# Patient Record
Sex: Male | Born: 1975 | Race: Black or African American | Hispanic: No | Marital: Single | Smoking: Current every day smoker
Health system: Southern US, Community
[De-identification: ages and names within clinical notes are randomized; demographics above are authoritative.]

---

## 2005-08-06 ENCOUNTER — Emergency Department: Payer: Self-pay | Admitting: Unknown Physician Specialty

## 2006-09-29 ENCOUNTER — Emergency Department: Payer: Self-pay | Admitting: Emergency Medicine

## 2006-10-08 ENCOUNTER — Emergency Department: Payer: Self-pay | Admitting: Emergency Medicine
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
## 2015-07-19 ENCOUNTER — Other Ambulatory Visit
Admission: EM | Admit: 2015-07-19 | Discharge: 2015-07-19 | Disposition: A | Discharge status: Home / Self Care | Attending: Family Medicine | Admitting: Family Medicine

## 2015-07-19 ENCOUNTER — Emergency Department: Admission: EM | Admit: 2015-07-19 | Discharge: 2015-07-19 | Discharge status: Absent without leave

## 2015-07-19 NOTE — ED Notes (Signed)
Pt brought in by [employer] for police blood draw only.  Pt is under arrest and police has warrant present, pt is consenting to blood draw.  Blood drawn from left AC and cleansed with betadine.  Samples given to [employer] officer A.  Troy Silva, pt tolerated procedure well.  Pt released to [employer] after sample drawn.

## 2016-07-24 ENCOUNTER — Emergency Department
Admission: EM | Admit: 2016-07-24 | Discharge: 2016-07-24 | Disposition: A | Discharge status: Home / Self Care | Attending: Emergency Medicine | Admitting: Emergency Medicine

## 2016-07-24 DIAGNOSIS — Y929 Unspecified place or not applicable: Secondary | ICD-10-CM | POA: Insufficient documentation

## 2016-07-24 DIAGNOSIS — W57XXXA Bitten or stung by nonvenomous insect and other nonvenomous arthropods, initial encounter: Secondary | ICD-10-CM | POA: Insufficient documentation

## 2016-07-24 DIAGNOSIS — L03113 Cellulitis of right upper limb: Secondary | ICD-10-CM | POA: Insufficient documentation

## 2016-07-24 DIAGNOSIS — Y999 Unspecified external cause status: Secondary | ICD-10-CM | POA: Insufficient documentation

## 2016-07-24 DIAGNOSIS — Y939 Activity, unspecified: Secondary | ICD-10-CM | POA: Insufficient documentation

## 2016-07-24 DIAGNOSIS — S50861A Insect bite (nonvenomous) of right forearm, initial encounter: Secondary | ICD-10-CM | POA: Insufficient documentation

## 2016-07-24 MED ORDER — OXYCODONE-ACETAMINOPHEN 5-325 MG PO TABS
1.0000 | ORAL_TABLET | Freq: Once | ORAL | Status: AC
  Administered 2016-07-24: 1 via ORAL
  Filled 2016-07-24: qty 1

## 2016-07-24 MED ORDER — SULFAMETHOXAZOLE-TRIMETHOPRIM 800-160 MG PO TABS: 2.0000 | ORAL_TABLET | Freq: Two times a day (BID) | ORAL | 0 refills | Status: AC

## 2016-07-24 MED ORDER — IBUPROFEN 800 MG PO TABS: 800.0000 mg | ORAL_TABLET | Freq: Three times a day (TID) | ORAL | 0 refills | Status: AC | PRN

## 2016-07-24 MED ORDER — SULFAMETHOXAZOLE-TRIMETHOPRIM 800-160 MG PO TABS
2.0000 | ORAL_TABLET | Freq: Once | ORAL | Status: AC
  Administered 2016-07-24: 2 via ORAL
  Filled 2016-07-24: qty 2

## 2016-07-24 MED ORDER — OXYCODONE-ACETAMINOPHEN 5-325 MG PO TABS: 1.0000 | ORAL_TABLET | ORAL | 0 refills | Status: AC | PRN

## 2016-07-24 MED ORDER — IBUPROFEN 800 MG PO TABS
800.0000 mg | ORAL_TABLET | Freq: Once | ORAL | Status: AC
  Administered 2016-07-24: 800 mg via ORAL
  Filled 2016-07-24: qty 1

## 2016-07-24 NOTE — Discharge Instructions (Signed)
1. Take antibiotic as prescribed (Bactrim DS 2 tablets twice daily 10 days). 2. Apply warm compress to affected area several times daily. 3. You may take pain medicines as needed (Motrin/Percocet #15). 4. Return to the ER for worsening symptoms, persistent vomiting, fever or other concerns.

## 2016-07-24 NOTE — ED Provider Notes (Signed)
[facility] Emergency Department Provider Note   ____________________________________________   First MD Initiated Contact with Patient 07/24/16 0401     (approximate)  I have reviewed the triage vital signs and the nursing notes.   HISTORY  Chief Complaint Wound Check    HPI Troy NANCARROW is a 40 y.o. male who presents to the ED from home with a chief complaint of insect bite. Patient reports insect bite to right forearm 3 days ago; thinks it might have been a spider.Complains of increased swelling and pain to affected area since. Denies associated fever, chills, chest pain, shortness of breath, abdominal pain, nausea, vomiting, diarrhea. Denies recent travel or trauma. Nothing makes his symptoms better or worse.   Past medical history None  There are no active problems to display for this patient.   No past surgical history on file.  Prior to Admission medications   Medication Sig Start Date End Date Taking? Authorizing Provider  ibuprofen (ADVIL,MOTRIN) 800 MG tablet Take 1 tablet (800 mg total) by mouth every 8 (eight) hours as needed for moderate pain. 07/24/16   Irean Hong, MD  oxyCODONE-acetaminophen (ROXICET) 5-325 MG tablet Take 1 tablet by mouth every 4 (four) hours as needed for severe pain. 07/24/16   Irean Hong, MD  sulfamethoxazole-trimethoprim (BACTRIM DS,SEPTRA DS) 800-160 MG tablet Take 2 tablets by mouth 2 (two) times daily. 07/24/16   Irean Hong, MD    Allergies Review of patient's allergies indicates no known allergies.  No family history on file.  Social History Social History  Substance Use Topics  . Smoking status: Not on file  . Smokeless tobacco: Not on file  . Alcohol use Not on file  Smoker  Review of Systems  Constitutional: No fever/chills. Eyes: No visual changes. ENT: No sore throat. Cardiovascular: Denies chest pain. Respiratory: Denies shortness of breath. Gastrointestinal: No abdominal pain.   No nausea, no vomiting.  No diarrhea.  No constipation. Genitourinary: Negative for dysuria. Musculoskeletal: Positive for right forearm pain secondary to insect bite. Negative for back pain. Skin: Negative for rash. Neurological: Negative for headaches, focal weakness or numbness.  10-point ROS otherwise negative.  ____________________________________________   PHYSICAL EXAM:  VITAL SIGNS: ED Triage Vitals [07/24/16 0138]  Enc Vitals Group     BP (!) 162/89     Pulse Rate (!) 103     Resp 18     Temp 98.9 F (37.2 C)     Temp Source Oral     SpO2 95 %     Weight 280 lb (127 kg)     Height 5\' 11"  (1.803 m)     Head Circumference      Peak Flow      Pain Score 8     Pain Loc      Pain Edu?      Excl. in GC?     Constitutional: Sleeping, easily awakened for exam. Alert and oriented. Well appearing and in no acute distress. Eyes: Conjunctivae are normal. PERRL. EOMI. Head: Atraumatic. Nose: No congestion/rhinnorhea. Mouth/Throat: Mucous membranes are moist.  Oropharynx non-erythematous. Neck: No stridor.   Cardiovascular: Normal rate, regular rhythm. Grossly normal heart sounds.  Good peripheral circulation. Respiratory: Normal respiratory effort.  No retractions. Lungs CTAB. Gastrointestinal: Soft and nontender. No distention. No abdominal bruits. No CVA tenderness. Musculoskeletal:  Right forearm with approximately 3 x 3 cm area of warmth and erythema with central scab. No associated fluctuance. Neurologic:  Normal speech and language.  No gross focal neurologic deficits are appreciated. No gait instability. Skin:  Skin is warm, dry and intact. No rash noted. Psychiatric: Mood and affect are normal. Speech and behavior are normal.  ____________________________________________   LABS (all labs ordered are listed, but only abnormal results are displayed)  Labs Reviewed - No data to  display ____________________________________________  EKG  None ____________________________________________  RADIOLOGY  None ____________________________________________   PROCEDURES  Procedure(s) performed: None  Procedures  Critical Care performed: No  ____________________________________________   INITIAL IMPRESSION / ASSESSMENT AND PLAN / ED COURSE  Pertinent labs & imaging results that were available during my care of the patient were reviewed by me and considered in my medical decision making (see chart for details).  40 year old male who presents with infected insect bite. Will place on Bactrim, advised warm compresses and encourage patient to follow-up with urgent care for recheck. Strict return precautions given. Patient verbalizes understanding and agrees with plan of care.  Clinical Course     ____________________________________________   FINAL CLINICAL IMPRESSION(S) / ED DIAGNOSES  Final diagnoses:  Cellulitis of right upper extremity  Insect bite, initial encounter      NEW MEDICATIONS STARTED DURING THIS VISIT:  New Prescriptions   IBUPROFEN (ADVIL,MOTRIN) 800 MG TABLET    Take 1 tablet (800 mg total) by mouth every 8 (eight) hours as needed for moderate pain.   OXYCODONE-ACETAMINOPHEN (ROXICET) 5-325 MG TABLET    Take 1 tablet by mouth every 4 (four) hours as needed for severe pain.   SULFAMETHOXAZOLE-TRIMETHOPRIM (BACTRIM DS,SEPTRA DS) 800-160 MG TABLET    Take 2 tablets by mouth 2 (two) times daily.     Note:  This document was prepared using Dragon voice recognition software and may include unintentional dictation errors.    Irean HongJade J Noralee Dutko, MD 07/24/16 [id number]

## 2016-07-24 NOTE — ED Triage Notes (Signed)
Pt noted are to right forearm monday that was small.  Since then has had increased swelling and pain. States thinks it might be a spider bite but did not see insect.

## 2016-09-21 ENCOUNTER — Emergency Department
Admission: EM | Admit: 2016-09-21 | Discharge: 2016-09-22 | Discharge status: Against Medical Advice | Payer: Self-pay | Source: Home / Self Care

## 2016-09-22 ENCOUNTER — Emergency Department (HOSPITAL_COMMUNITY)
Admission: EM | Admit: 2016-09-22 | Discharge: 2016-09-22 | Disposition: A | Discharge status: Home / Self Care | Payer: Self-pay | Attending: Emergency Medicine | Admitting: Emergency Medicine

## 2016-09-22 ENCOUNTER — Encounter (HOSPITAL_COMMUNITY): Payer: Self-pay | Admitting: Emergency Medicine

## 2016-09-22 DIAGNOSIS — F17210 Nicotine dependence, cigarettes, uncomplicated: Secondary | ICD-10-CM | POA: Insufficient documentation

## 2016-09-22 DIAGNOSIS — Y92009 Unspecified place in unspecified non-institutional (private) residence as the place of occurrence of the external cause: Secondary | ICD-10-CM | POA: Insufficient documentation

## 2016-09-22 DIAGNOSIS — Y9389 Activity, other specified: Secondary | ICD-10-CM | POA: Insufficient documentation

## 2016-09-22 DIAGNOSIS — W231XXA Caught, crushed, jammed, or pinched between stationary objects, initial encounter: Secondary | ICD-10-CM | POA: Insufficient documentation

## 2016-09-22 DIAGNOSIS — Y999 Unspecified external cause status: Secondary | ICD-10-CM | POA: Insufficient documentation

## 2016-09-22 DIAGNOSIS — S61411A Laceration without foreign body of right hand, initial encounter: Secondary | ICD-10-CM | POA: Insufficient documentation

## 2016-09-22 DIAGNOSIS — Z23 Encounter for immunization: Secondary | ICD-10-CM | POA: Insufficient documentation

## 2016-09-22 MED ORDER — BACITRACIN ZINC 500 UNIT/GM EX OINT: 1.0000 "application " | TOPICAL_OINTMENT | Freq: Two times a day (BID) | CUTANEOUS | 0 refills | Status: AC

## 2016-09-22 MED ORDER — LIDOCAINE-EPINEPHRINE (PF) 1 %-1:200000 IJ SOLN
10.0000 mL | Freq: Once | INTRAMUSCULAR | Status: AC
  Administered 2016-09-22: 10 mL via INTRADERMAL
  Filled 2016-09-22: qty 10

## 2016-09-22 MED ORDER — TETANUS-DIPHTH-ACELL PERTUSSIS 5-2.5-18.5 LF-MCG/0.5 IM SUSP
0.5000 mL | Freq: Once | INTRAMUSCULAR | Status: AC
  Administered 2016-09-22: 0.5 mL via INTRAMUSCULAR
  Filled 2016-09-22: qty 0.5

## 2016-09-22 MED ORDER — OXYCODONE-ACETAMINOPHEN 5-325 MG PO TABS
1.0000 | ORAL_TABLET | Freq: Once | ORAL | Status: AC
  Administered 2016-09-22: 1 via ORAL
  Filled 2016-09-22: qty 1

## 2016-09-22 NOTE — ED Triage Notes (Signed)
Pt was attempting to enter his home with groceries in hand, when his screen door caught him across the hand. Reports laceration to R hand that he states is approx 3in long and deep. [facility], but came here with he was told the wait would be 2hrs. Presents with hand bandaged with dressing and gauze.

## 2016-09-22 NOTE — ED Notes (Signed)
Suture cart at bedside 

## 2016-09-22 NOTE — ED Provider Notes (Signed)
[facility]-EMERGENCY DEPT Provider Note   CSN: [id number] Arrival date & time: 09/22/16  0151    History   Chief Complaint Chief Complaint  Patient presents with  . Extremity Laceration    HPI Troy Silva is a 40 y.o. male.  40 year old male percent to the emergency department for evaluation of a laceration to his right hand after he caught his hand on the metal edge of a screen door. Is it occurred at approximately 2300 this evening. Patient denies any decreased sensation or problems with range of motion of his right hand or fingers. No medications taken prior to arrival. Last tetanus was 15 years ago.   The history is provided by the patient. No language interpreter was used.  Laceration   The incident occurred 3 to 5 hours ago. The laceration is located on the right hand. The laceration is 4 cm in size. The laceration mechanism was a a metal edge. The pain is mild. The pain has been constant since onset. He reports no foreign bodies present. His tetanus status is out of date.    History reviewed. No pertinent past medical history.  There are no active problems to display for this patient.   History reviewed. No pertinent surgical history.     Home Medications    Prior to Admission medications   Medication Sig Start Date End Date Taking? Authorizing Provider  ibuprofen (ADVIL,MOTRIN) 800 MG tablet Take 1 tablet (800 mg total) by mouth every 8 (eight) hours as needed for moderate pain. 07/24/16   Irean HongJade J Sung, MD  oxyCODONE-acetaminophen (ROXICET) 5-325 MG tablet Take 1 tablet by mouth every 4 (four) hours as needed for severe pain. 07/24/16   Irean HongJade J Sung, MD  sulfamethoxazole-trimethoprim (BACTRIM DS,SEPTRA DS) 800-160 MG tablet Take 2 tablets by mouth 2 (two) times daily. 07/24/16   Irean HongJade J Sung, MD    Family History History reviewed. No pertinent family history.  Social History Social History  Substance Use Topics  . Smoking status: Current Every Day Smoker   Packs/day: 0.50    Years: 15.00    Types: Cigarettes  . Smokeless tobacco: Never Used  . Alcohol use 4.2 oz/week    7 Cans of beer per week     Allergies   Patient has no known allergies.   Review of Systems Review of Systems Ten systems reviewed and are negative for acute change, except as noted in the HPI.    Physical Exam Updated Vital Signs BP 138/82 (BP Location: Left Arm)   Pulse 80   Temp 98.6 F (37 C) (Oral)   Resp 17   Ht 6' (1.829 m)   Wt 129.3 kg   SpO2 96%   BMI 38.65 kg/m   Physical Exam  Constitutional: He is oriented to person, place, and time. He appears well-developed and well-nourished. No distress.  Nontoxic and in NAD  HENT:  Head: Normocephalic and atraumatic.  Eyes: Conjunctivae and EOM are normal. No scleral icterus.  Neck: Normal range of motion.  Cardiovascular: Normal rate, regular rhythm and intact distal pulses.   Distal radial pulse 2+. Capillary refill brisk in all digits.  Pulmonary/Chest: Effort normal. No respiratory distress.  Musculoskeletal: Normal range of motion.       Right hand: He exhibits tenderness and laceration. He exhibits normal range of motion, no bony tenderness, normal capillary refill, no deformity and no swelling. Normal sensation noted. Normal strength noted.       Hands: 5/5 strength against resistance of FDP,  FDS, and extensors of the right second digit. Laceration overlying the second MCP joint. No bony exposure or tendon exposure. Normal range of motion of the right hand and all digits. Strength intact. No foreign bodies visualized or palpated.  Neurological: He is alert and oriented to person, place, and time. He exhibits normal muscle tone. Coordination normal.  Sensation to light touch intact in all digits of the right hand  Skin: Skin is warm and dry. No rash noted. He is not diaphoretic. No erythema. No pallor.  Psychiatric: He has a normal mood and affect. His behavior is normal.  Nursing note and vitals  reviewed.    ED Treatments / Results  Labs (all labs ordered are listed, but only abnormal results are displayed) Labs Reviewed - No data to display  EKG  EKG Interpretation None       Radiology No results found.  Procedures Procedures (including critical care time)  Medications Ordered in ED Medications  lidocaine-EPINEPHrine (XYLOCAINE-EPINEPHrine) 1 %-1:200000 (PF) injection 10 mL (not administered)  oxyCODONE-acetaminophen (PERCOCET/ROXICET) 5-325 MG per tablet 1 tablet (not administered)  Tdap (BOOSTRIX) injection 0.5 mL (not administered)    LACERATION REPAIR Performed by: Antony MaduraHUMES, Jill Ruppe Authorized by: Antony MaduraHUMES, Tahir Blank Consent: Verbal consent obtained. Risks and benefits: risks, benefits and alternatives were discussed Consent given by: patient Patient identity confirmed: provided demographic data Prepped and Draped in normal sterile fashion Wound explored  Laceration Location: R hand  Laceration Length: 4cm  No Foreign Bodies seen or palpated  Anesthesia: local infiltration  Local anesthetic: lidocaine 2% with epinephrine  Anesthetic total: 3 ml  Irrigation method: syringe Amount of cleaning: standard  Skin closure: 5-0 ethilon  Number of sutures: 7  Technique: simple interrupted  Patient tolerance: Patient tolerated the procedure well with no immediate complications.   Initial Impression / Assessment and Plan / ED Course  I have reviewed the triage vital signs and the nursing notes.  Pertinent labs & imaging results that were available during my care of the patient were reviewed by me and considered in my medical decision making (see chart for details).  Clinical Course     Tdap booster given. Pressure irrigation performed. Laceration occurred < 8 hours prior to repair which was well tolerated. Pt has no comorbidities to effect normal wound healing. Discussed suture home care with pt and answered questions. Pt to follow up for wound check and  suture removal in 10-12 days. Pt is hemodynamically stable with no complaints prior to discharge.     Final Clinical Impressions(s) / ED Diagnoses   Final diagnoses:  Laceration of right hand, foreign body presence unspecified, initial encounter    New Prescriptions New Prescriptions   No medications on file     Antony MaduraKelly Vic Esco, PA-C 09/22/16 0436    Layla MawKristen N Ward, DO 09/22/16 [phone]

## 2016-09-25 ENCOUNTER — Emergency Department (HOSPITAL_COMMUNITY)
Admission: EM | Admit: 2016-09-25 | Discharge: 2016-09-25 | Disposition: A | Discharge status: Home / Self Care | Payer: Self-pay | Attending: Emergency Medicine | Admitting: Emergency Medicine

## 2016-09-25 ENCOUNTER — Encounter (HOSPITAL_COMMUNITY): Payer: Self-pay

## 2016-09-25 DIAGNOSIS — F17210 Nicotine dependence, cigarettes, uncomplicated: Secondary | ICD-10-CM | POA: Insufficient documentation

## 2016-09-25 DIAGNOSIS — T148XXA Other injury of unspecified body region, initial encounter: Secondary | ICD-10-CM

## 2016-09-25 DIAGNOSIS — L089 Local infection of the skin and subcutaneous tissue, unspecified: Secondary | ICD-10-CM

## 2016-09-25 DIAGNOSIS — B354 Tinea corporis: Secondary | ICD-10-CM | POA: Insufficient documentation

## 2016-09-25 MED ORDER — LIDOCAINE HCL (PF) 1 % IJ SOLN
INTRAMUSCULAR | Status: AC
  Administered 2016-09-25: 0.9 mL
  Filled 2016-09-25: qty 5

## 2016-09-25 MED ORDER — CEFTRIAXONE SODIUM 1 G IJ SOLR
1.0000 g | Freq: Once | INTRAMUSCULAR | Status: AC
  Administered 2016-09-25: 1 g via INTRAMUSCULAR
  Filled 2016-09-25: qty 10

## 2016-09-25 MED ORDER — CEPHALEXIN 500 MG PO CAPS: 500.0000 mg | ORAL_CAPSULE | Freq: Three times a day (TID) | ORAL | 0 refills | Status: AC

## 2016-09-25 MED ORDER — CLOTRIMAZOLE-BETAMETHASONE 1-0.05 % EX CREA: TOPICAL_CREAM | CUTANEOUS | 1 refills | Status: AC

## 2016-09-25 MED ORDER — OXYCODONE-ACETAMINOPHEN 5-325 MG PO TABS
2.0000 | ORAL_TABLET | Freq: Once | ORAL | Status: AC
  Administered 2016-09-25: 2 via ORAL
  Filled 2016-09-25: qty 2

## 2016-09-25 MED ORDER — HYDROCODONE-ACETAMINOPHEN 5-325 MG PO TABS: 1.0000 | ORAL_TABLET | ORAL | 0 refills | Status: AC | PRN

## 2016-09-25 NOTE — ED Triage Notes (Signed)
Pt has sutures across the top of his right hand. Pt reports he noticed redness swelling and drainage starting yesterday. Site is warm to touch and slightly red in color.

## 2016-09-25 NOTE — Discharge Instructions (Signed)
Apply warm compress, and gently massage hand toward the wound 2-3 times per day. Recheck here in 48 hours. Keflex as prescribed. [place]oden for pain--no driving or working within 8 hours of taking Vicodin.

## 2016-09-25 NOTE — ED Provider Notes (Signed)
[facility]-EMERGENCY DEPT Provider Note   CSN: [id number] Arrival date & time: 09/25/16  1627   By signing my name below, I, Troy Silva, attest that this documentation has been prepared under the direction and in the presence of [name], MD . Electronically Signed: Teofilo PodMatthew P. Silva, ED Scribe. 09/25/2016. 5:54 PM.   History   Chief Complaint Chief Complaint  Patient presents with  . Wound Check    The history is provided by the patient. No language interpreter was used.   HPI Comments:  Troy Silva is a 40 y.o. male who presents to the Emergency Department, here for a wound check. Pt sustained a laceration to his right hand on a metal screen 5 days ago and had 7 sutures placed. Pt complains of swelling to the area and mild drainage from the wound site. Pt also notes a rash to his left hand. No alleviating factors noted. Pt denies other associated symptoms.   History reviewed. No pertinent past medical history.  There are no active problems to display for this patient.   History reviewed. No pertinent surgical history.     Home Medications    Prior to Admission medications   Medication Sig Start Date End Date Taking? Authorizing Provider  bacitracin ointment Apply 1 application topically 2 (two) times daily. 09/22/16   Antony MaduraKelly Humes, PA-C  cephALEXin (KEFLEX) 500 MG capsule Take 1 capsule (500 mg total) by mouth 3 (three) times daily. 09/25/16   Rolland PorterMark Mahati Vajda, MD  clotrimazole-betamethasone (LOTRISONE) cream Apply to affected area 2 times daily prn 09/25/16   Rolland PorterMark Mackson Botz, MD  HYDROcodone-acetaminophen (NORCO/VICODIN) 5-325 MG tablet Take 1 tablet by mouth every 4 (four) hours as needed. 09/25/16   Rolland PorterMark Aarian Griffie, MD  ibuprofen (ADVIL,MOTRIN) 800 MG tablet Take 1 tablet (800 mg total) by mouth every 8 (eight) hours as needed for moderate pain. 07/24/16   Irean HongJade J Sung, MD  oxyCODONE-acetaminophen (ROXICET) 5-325 MG tablet Take 1 tablet by mouth every 4 (four) hours as  needed for severe pain. 07/24/16   Irean HongJade J Sung, MD  sulfamethoxazole-trimethoprim (BACTRIM DS,SEPTRA DS) 800-160 MG tablet Take 2 tablets by mouth 2 (two) times daily. 07/24/16   Irean HongJade J Sung, MD    Family History History reviewed. No pertinent family history.  Social History Social History  Substance Use Topics  . Smoking status: Current Every Day Smoker    Packs/day: 0.50    Years: 15.00    Types: Cigarettes  . Smokeless tobacco: Never Used  . Alcohol use 4.2 oz/week    7 Cans of beer per week     Allergies   Patient has no known allergies.   Review of Systems Review of Systems  Constitutional: Negative for appetite change, chills, diaphoresis, fatigue and fever.  HENT: Negative for mouth sores, sore throat and trouble swallowing.   Eyes: Negative for visual disturbance.  Respiratory: Negative for cough, chest tightness, shortness of breath and wheezing.   Cardiovascular: Negative for chest pain.  Gastrointestinal: Negative for abdominal distention, abdominal pain, diarrhea, nausea and vomiting.  Endocrine: Negative for polydipsia, polyphagia and polyuria.  Genitourinary: Negative for dysuria, frequency and hematuria.  Musculoskeletal: Negative for gait problem.  Skin: Positive for wound. Negative for color change and pallor.  Neurological: Negative for dizziness, syncope, light-headedness and headaches.  Hematological: Does not bruise/bleed easily.  Psychiatric/Behavioral: Negative for behavioral problems and confusion.     Physical Exam Updated Vital Signs BP (!) 165/104 (BP Location: Left Arm)   Pulse 102  Temp 98.9 F (37.2 C) (Oral)   Resp 18   SpO2 96%   Physical Exam  Constitutional: He is oriented to person, place, and time. He appears well-developed and well-nourished. No distress.  HENT:  Head: Normocephalic.  Eyes: Conjunctivae are normal. Pupils are equal, round, and reactive to light. No scleral icterus.  Neck: Normal range of motion. Neck  supple. No thyromegaly present.  Cardiovascular: Normal rate and regular rhythm.  Exam reveals no gallop and no friction rub.   No murmur heard. Pulmonary/Chest: Effort normal and breath sounds normal. No respiratory distress. He has no wheezes. He has no rales.  Abdominal: Soft. Bowel sounds are normal. He exhibits no distension. There is no tenderness. There is no rebound.  Musculoskeletal: Normal range of motion.  Sutured laceration on the right hand dorsum. Overlying the fourth metacarpal. Redness and soft tissue swelling. She states he hasn't been able to express "pus" from it.  Neurological: He is alert and oriented to person, place, and time.  Skin: Skin is warm and dry. No rash noted.  Psychiatric: He has a normal mood and affect. His behavior is normal.     ED Treatments / Results  DIAGNOSTIC STUDIES:  Oxygen Saturation is 96% on RA, normal by my interpretation.    COORDINATION OF CARE:  5:54 PM Discussed treatment plan with pt at bedside and pt agreed to plan.   Labs (all labs ordered are listed, but only abnormal results are displayed) Labs Reviewed - No data to display  EKG  EKG Interpretation None       Radiology No results found.  Procedures Procedures (including critical care time)  Medications Ordered in ED Medications  cefTRIAXone (ROCEPHIN) injection 1 g (not administered)  oxyCODONE-acetaminophen (PERCOCET/ROXICET) 5-325 MG per tablet 2 tablet (2 tablets Oral Given 09/25/16 1809)     Initial Impression / Assessment and Plan / ED Course  I have reviewed the triage vital signs and the nursing notes.  Pertinent labs & imaging results that were available during my care of the patient were reviewed by me and considered in my medical decision making (see chart for details).  Clinical Course    Normal tendon function. No swelling volar. Second, and fourth suture removed from this wound overlying the areas of greatest induration and inflammation. Was  unable to place a cotton tip applicator then with massage of the surrounding area. Lens was expressed. Remainder the wound was left intact. Compressive Ace wrap was placed. Was given IM Rocephin. Discharge home with by mouth Keflex. Asked to continue warm compresses and gentle massage towards the wound. Recheck with worsening tomorrow or routinely in 48 hours. May need additional removal of sutures.  Of note, patient has area of tinea Tinea in the left upper extremity between the fourth and fifth digit webspace. Given a prescription for Lotrisone cream for this   Final Clinical Impressions(s) / ED Diagnoses   Final diagnoses:  Wound infection  Tinea corporis    New Prescriptions New Prescriptions   CEPHALEXIN (KEFLEX) 500 MG CAPSULE    Take 1 capsule (500 mg total) by mouth 3 (three) times daily.   CLOTRIMAZOLE-BETAMETHASONE (LOTRISONE) CREAM    Apply to affected area 2 times daily prn   HYDROCODONE-ACETAMINOPHEN (NORCO/VICODIN) 5-325 MG TABLET    Take 1 tablet by mouth every 4 (four) hours as needed.      Rolland PorterMark Ilyse Tremain, MD 09/25/16 [phone]

## 2019-04-29 ENCOUNTER — Other Ambulatory Visit: Payer: Self-pay

## 2019-04-29 ENCOUNTER — Emergency Department
Admission: EM | Admit: 2019-04-29 | Discharge: 2019-04-29 | Disposition: A | Discharge status: Home / Self Care | Payer: Self-pay | Attending: Emergency Medicine | Admitting: Emergency Medicine

## 2019-04-29 ENCOUNTER — Emergency Department: Payer: Self-pay

## 2019-04-29 ENCOUNTER — Encounter: Payer: Self-pay | Admitting: Emergency Medicine

## 2019-04-29 DIAGNOSIS — R0602 Shortness of breath: Secondary | ICD-10-CM | POA: Insufficient documentation

## 2019-04-29 DIAGNOSIS — M2550 Pain in unspecified joint: Secondary | ICD-10-CM | POA: Insufficient documentation

## 2019-04-29 DIAGNOSIS — J029 Acute pharyngitis, unspecified: Secondary | ICD-10-CM | POA: Insufficient documentation

## 2019-04-29 DIAGNOSIS — F17210 Nicotine dependence, cigarettes, uncomplicated: Secondary | ICD-10-CM | POA: Insufficient documentation

## 2019-04-29 DIAGNOSIS — Z20828 Contact with and (suspected) exposure to other viral communicable diseases: Secondary | ICD-10-CM | POA: Insufficient documentation

## 2019-04-29 DIAGNOSIS — Z79899 Other long term (current) drug therapy: Secondary | ICD-10-CM | POA: Insufficient documentation

## 2019-04-29 LAB — CBC WITH DIFFERENTIAL/PLATELET
Abs Immature Granulocytes: 0.16 10*3/uL — ABNORMAL HIGH (ref 0.00–0.07)
Basophils Absolute: 0.1 10*3/uL (ref 0.0–0.1)
Basophils Relative: 0 %
Eosinophils Absolute: 0 10*3/uL (ref 0.0–0.5)
Eosinophils Relative: 0 %
HCT: 44.6 % (ref 39.0–52.0)
Hemoglobin: 14.9 g/dL (ref 13.0–17.0)
Immature Granulocytes: 1 %
Lymphocytes Relative: 9 %
Lymphs Abs: 1.9 10*3/uL (ref 0.7–4.0)
MCH: 31.3 pg (ref 26.0–34.0)
MCHC: 33.4 g/dL (ref 30.0–36.0)
MCV: 93.7 fL (ref 80.0–100.0)
Monocytes Absolute: 1.4 10*3/uL — ABNORMAL HIGH (ref 0.1–1.0)
Monocytes Relative: 6 %
Neutro Abs: 19.1 10*3/uL — ABNORMAL HIGH (ref 1.7–7.7)
Neutrophils Relative %: 84 %
Platelets: 270 10*3/uL (ref 150–400)
RBC: 4.76 MIL/uL (ref 4.22–5.81)
RDW: 11.7 % (ref 11.5–15.5)
WBC: 22.6 10*3/uL — ABNORMAL HIGH (ref 4.0–10.5)
nRBC: 0 % (ref 0.0–0.2)

## 2019-04-29 LAB — COMPREHENSIVE METABOLIC PANEL
ALT: 20 U/L (ref 0–44)
AST: 15 U/L (ref 15–41)
Albumin: 4.2 g/dL (ref 3.5–5.0)
Alkaline Phosphatase: 67 U/L (ref 38–126)
Anion gap: 10 (ref 5–15)
BUN: 10 mg/dL (ref 6–20)
CO2: 21 mmol/L — ABNORMAL LOW (ref 22–32)
Calcium: 9.2 mg/dL (ref 8.9–10.3)
Chloride: 102 mmol/L (ref 98–111)
Creatinine, Ser: 1.21 mg/dL (ref 0.61–1.24)
GFR calc Af Amer: >60 mL/min (ref >60)
GFR calc non Af Amer: >60 mL/min (ref >60)
Glucose, Bld: 138 mg/dL — ABNORMAL HIGH (ref 70–99)
Potassium: 4 mmol/L (ref 3.5–5.1)
Sodium: 133 mmol/L — ABNORMAL LOW (ref 135–145)
Total Bilirubin: 1.4 mg/dL — ABNORMAL HIGH (ref 0.3–1.2)
Total Protein: 7.7 g/dL (ref 6.5–8.1)

## 2019-04-29 LAB — SARS CORONAVIRUS 2 BY RT PCR (HOSPITAL ORDER, PERFORMED IN ~~LOC~~ HOSPITAL LAB): SARS Coronavirus 2: NEGATIVE

## 2019-04-29 LAB — TROPONIN I (HIGH SENSITIVITY)
Troponin I (High Sensitivity): 7 ng/L (ref <18)
Troponin I (High Sensitivity): 7 ng/L (ref <18)

## 2019-04-29 LAB — BRAIN NATRIURETIC PEPTIDE: B Natriuretic Peptide: 40 pg/mL (ref 0.0–100.0)

## 2019-04-29 LAB — GROUP A STREP BY PCR: Group A Strep by PCR: NOT DETECTED

## 2019-04-29 MED ORDER — DEXAMETHASONE SODIUM PHOSPHATE 10 MG/ML IJ SOLN
10.0000 mg | Freq: Once | INTRAMUSCULAR | Status: AC
  Administered 2019-04-29: 14:00:00 10 mg via INTRAVENOUS
  Filled 2019-04-29: qty 1

## 2019-04-29 MED ORDER — ACETAMINOPHEN 325 MG PO TABS
650.0000 mg | ORAL_TABLET | Freq: Once | ORAL | Status: AC
  Administered 2019-04-29: 12:00:00 650 mg via ORAL
  Filled 2019-04-29: qty 2

## 2019-04-29 MED ORDER — KETOROLAC TROMETHAMINE 30 MG/ML IJ SOLN
30.0000 mg | Freq: Once | INTRAMUSCULAR | Status: AC
  Administered 2019-04-29: 14:00:00 30 mg via INTRAVENOUS
  Filled 2019-04-29: qty 1

## 2019-04-29 NOTE — Discharge Instructions (Addendum)
Your work-up was reassuring except for an elevated white count.  Given you have a known coronavirus contact you should remain quarantine at home and presume that you are positive.  Continue taking Tylenol and ibuprofen for your symptoms.  Return to ER for worsening neck stiffness, difficulty eating, shortness of breath or any other concerns.     Person Under Monitoring Name: Troy Silva  Location: [address] [place] [address]   Infection Prevention Recommendations for Individuals Confirmed to have, or Being Evaluated for, 2019 Novel Coronavirus (COVID-19) Infection Who Receive Care at Home  Individuals who are confirmed to have, or are being evaluated for, COVID-19 should follow the prevention steps below until a healthcare provider or local or state health department says they can return to normal activities.  Stay home except to get medical care You should restrict activities outside your home, except for getting medical care. Do not go to work, school, or public areas, and do not use public transportation or taxis.  Call ahead before visiting your doctor Before your medical appointment, call the healthcare provider and tell them that you have, or are being evaluated for, COVID-19 infection. This will help the healthcare providers office take steps to keep other people from getting infected. Ask your healthcare provider to call the local or state health department.  Monitor your symptoms Seek prompt medical attention if your illness is worsening (e.g., difficulty breathing). Before going to your medical appointment, call the healthcare provider and tell them that you have, or are being evaluated for, COVID-19 infection. Ask your healthcare provider to call the local or state health department.  Wear a facemask You should wear a facemask that covers your nose and mouth when you are in the same room with other people and when you visit a healthcare provider. People  who live with or visit you should also wear a facemask while they are in the same room with you.  Separate yourself from other people in your home As much as possible, you should stay in a different room from other people in your home. Also, you should use a separate bathroom, if available.  Avoid sharing household items You should not share dishes, drinking glasses, cups, eating utensils, towels, bedding, or other items with other people in your home. After using these items, you should wash them thoroughly with soap and water.  Cover your coughs and sneezes Cover your mouth and nose with a tissue when you cough or sneeze, or you can cough or sneeze into your sleeve. Throw used tissues in a lined trash can, and immediately wash your hands with soap and water for at least 20 seconds or use an alcohol-based hand rub.  Wash your [facility] your hands often and thoroughly with soap and water for at least 20 seconds. You can use an alcohol-based hand sanitizer if soap and water are not available and if your hands are not visibly dirty. Avoid touching your eyes, nose, and mouth with unwashed hands.   Prevention Steps for Caregivers and Household Members of Individuals Confirmed to have, or Being Evaluated for, COVID-19 Infection Being Cared for in the Home  If you live with, or provide care at home for, a person confirmed to have, or being evaluated for, COVID-19 infection please follow these guidelines to prevent infection:  Follow healthcare providers instructions Make sure that you understand and can help the patient follow any healthcare provider instructions for all care.  Provide for the patients basic  needs You should help the patient with basic needs in the home and provide support for getting groceries, prescriptions, and other personal needs.  Monitor the patients symptoms If they are getting sicker, call his or her medical provider and tell them that the patient has, or  is being evaluated for, COVID-19 infection. This will help the healthcare providers office take steps to keep other people from getting infected. Ask the healthcare provider to call the local or state health department.  Limit the number of people who have contact with the patient If possible, have only one caregiver for the patient. Other household members should stay in another home or place of residence. If this is not possible, they should stay in another room, or be separated from the patient as much as possible. Use a separate bathroom, if available. Restrict visitors who do not have an essential need to be in the home.  Keep older adults, very young children, and other sick people away from the patient Keep older adults, very young children, and those who have compromised immune systems or chronic health conditions away from the patient. This includes people with chronic heart, lung, or kidney conditions, diabetes, and cancer.  Ensure good ventilation Make sure that shared spaces in the home have good air flow, such as from an air conditioner or an opened window, weather permitting.  Wash your hands often Wash your hands often and thoroughly with soap and water for at least 20 seconds. You can use an alcohol based hand sanitizer if soap and water are not available and if your hands are not visibly dirty. Avoid touching your eyes, nose, and mouth with unwashed hands. Use disposable paper towels to dry your hands. If not available, use dedicated cloth towels and replace them when they become wet.  Wear a facemask and gloves Wear a disposable facemask at all times in the room and gloves when you touch or have contact with the patients blood, body fluids, and/or secretions or excretions, such as sweat, saliva, sputum, nasal mucus, vomit, urine, or feces.  Ensure the mask fits over your nose and mouth tightly, and do not touch it during use. Throw out disposable facemasks and gloves after  using them. Do not reuse. Wash your hands immediately after removing your facemask and gloves. If your personal clothing becomes contaminated, carefully remove clothing and launder. Wash your hands after handling contaminated clothing. Place all used disposable facemasks, gloves, and other waste in a lined container before disposing them with other household waste. Remove gloves and wash your hands immediately after handling these items.  Do not share dishes, glasses, or other household items with the patient Avoid sharing household items. You should not share dishes, drinking glasses, cups, eating utensils, towels, bedding, or other items with a patient who is confirmed to have, or being evaluated for, COVID-19 infection. After the person uses these items, you should wash them thoroughly with soap and water.  Wash laundry thoroughly Immediately remove and wash clothes or bedding that have blood, body fluids, and/or secretions or excretions, such as sweat, saliva, sputum, nasal mucus, vomit, urine, or feces, on them. Wear gloves when handling laundry from the patient. Read and follow directions on labels of laundry or clothing items and detergent. In general, wash and dry with the warmest temperatures recommended on the label.  Clean all areas the individual has used often Clean all touchable surfaces, such as counters, tabletops, doorknobs, bathroom fixtures, toilets, phones, keyboards, tablets, and bedside tables, every day.  Also, clean any surfaces that may have blood, body fluids, and/or secretions or excretions on them. Wear gloves when cleaning surfaces the patient has come in contact with. Use a diluted bleach solution (e.g., dilute bleach with 1 part bleach and 10 parts water) or a household disinfectant with a label that says EPA-registered for coronaviruses. To make a bleach solution at home, add 1 tablespoon of bleach to 1 quart (4 cups) of water. For a larger supply, add  cup of bleach  to 1 gallon (16 cups) of water. Read labels of cleaning products and follow recommendations provided on product labels. Labels contain instructions for safe and effective use of the cleaning product including precautions you should take when applying the product, such as wearing gloves or eye protection and making sure you have good ventilation during use of the product. Remove gloves and wash hands immediately after cleaning.  Monitor yourself for signs and symptoms of illness Caregivers and household members are considered close contacts, should monitor their health, and will be asked to limit movement outside of the home to the extent possible. Follow the monitoring steps for close contacts listed on the symptom monitoring form.   ? If you have additional questions, contact your local health department or call the epidemiologist on call at [phone] (available 24/7). ? This guidance is subject to change. For the most up-to-date guidance from [facility], please refer to their website: TripMetro.huhttps://www.cdc.gov/coronavirus/2019-ncov/hcp/guidance-prevent-spread.html

## 2019-04-29 NOTE — ED Triage Notes (Signed)
Pt presents to ED via POV with initial complaints of sore throat and flu like symptoms, upon arrival to triage room pt states "I can't breathe, I can't breathe". Pt noted to be hyperventilating, removed his mask and refusing to put it back on. Pt noted to be diaphoretic however also wearing a hoodie, took hoodie off patient. Pt states to this RN and Kadijah, EDT "where am I?" Pt appeared to have a spaced out look on his face. Pt promptly taken back to room 6. Upon arrival to room VSS and WNL, pt is now able to answer questions appropriately and diaphoresis has subsided.

## 2019-04-29 NOTE — ED Provider Notes (Signed)
[facility] Emergency Department Provider Note  ____________________________________________   First MD Initiated Contact with Patient 04/29/19 1131     (approximate)  I have reviewed the triage vital signs and the nursing notes.   HISTORY  Chief Complaint Sore Throat and Shortness of Breath    HPI Troy Silva is a 43 y.o. male otherwise healthy who presents for sore throat.  Patient presented for sore throat that started yesterday.  He denies difficulty to swallow.  Pain is constant, worse with eating, nothing makes it better including Tylenol he took last night.  He also endorses having aches in his joints.  While waiting for room and he was wearing a sweatshirt and all of a sudden got very hot and felt like he could not breathe.  Patient was immediately brought back to a room and was satting 100%.  Patient says that now his breathing feels at baseline.  He has had a positive coronavirus contact.          History reviewed. No pertinent past medical history.  There are no active problems to display for this patient.   History reviewed. No pertinent surgical history.  Prior to Admission medications   Medication Sig Start Date End Date Taking? Authorizing Provider  bacitracin ointment Apply 1 application topically 2 (two) times daily. 09/22/16   Antonietta Breach, PA-C  cephALEXin (KEFLEX) 500 MG capsule Take 1 capsule (500 mg total) by mouth 3 (three) times daily. 09/25/16   Tanna Furry, MD  clotrimazole-betamethasone (LOTRISONE) cream Apply to affected area 2 times daily prn 09/25/16   Tanna Furry, MD  HYDROcodone-acetaminophen (NORCO/VICODIN) 5-325 MG tablet Take 1 tablet by mouth every 4 (four) hours as needed. 09/25/16   Tanna Furry, MD  ibuprofen (ADVIL,MOTRIN) 800 MG tablet Take 1 tablet (800 mg total) by mouth every 8 (eight) hours as needed for moderate pain. 07/24/16   Paulette Blanch, MD  oxyCODONE-acetaminophen (ROXICET) 5-325 MG tablet Take  1 tablet by mouth every 4 (four) hours as needed for severe pain. 07/24/16   Paulette Blanch, MD  sulfamethoxazole-trimethoprim (BACTRIM DS,SEPTRA DS) 800-160 MG tablet Take 2 tablets by mouth 2 (two) times daily. 07/24/16   Paulette Blanch, MD    Allergies Patient has no known allergies.  History reviewed. No pertinent family history.  Social History Social History   Tobacco Use   Smoking status: Current Every Day Smoker    Packs/day: 0.50    Years: 15.00    Pack years: 7.50    Types: Cigarettes   Smokeless tobacco: Never Used  Substance Use Topics   Alcohol use: Yes    Alcohol/week: 7.0 standard drinks    Types: 7 Cans of beer per week   Drug use: Yes    Frequency: 2.0 times per week    Types: Marijuana      Review of Systems Constitutional: No fever/chills Eyes: No visual changes. ENT: Positive sore throat Cardiovascular: Denies chest pain. Respiratory: Denies shortness of breath. Gastrointestinal: No abdominal pain.  No nausea, no vomiting.  No diarrhea.  No constipation. Genitourinary: Negative for dysuria. Musculoskeletal: Negative for back pain.  Positive joint pain Skin: Negative for rash. Neurological: Negative for headaches, focal weakness or numbness. All other ROS negative ____________________________________________   PHYSICAL EXAM:  VITAL SIGNS: ED Triage Vitals  Enc Vitals Group     BP 04/29/19 1129 133/82     Pulse Rate 04/29/19 1129 89     Resp --  Temp 04/29/19 1129 100.2 F (37.9 C)     Temp Source 04/29/19 1129 Oral     SpO2 04/29/19 1129 97 %     Weight 04/29/19 1126 300 lb (136.1 kg)     Height 04/29/19 1126 5\' 11"  (1.803 m)     Head Circumference --      Peak Flow --      Pain Score 04/29/19 1125 10     Pain Loc --      Pain Edu? --      Excl. in GC? --     Constitutional: Alert and oriented. Well appearing and in no acute distress. Eyes: Conjunctivae are normal. EOMI. Head: Atraumatic. Nose: No  congestion/rhinnorhea. Mouth/Throat: Mucous membranes are moist.  OP clear without any exudates. Neck: No stridor. Trachea Midline. FROM Cardiovascular: Normal rate, regular rhythm. Grossly normal heart sounds.  Good peripheral circulation. Respiratory: Normal respiratory effort.  No retractions. Lungs CTAB. Gastrointestinal: Soft and nontender. No distention. No abdominal bruits.  Musculoskeletal: No lower extremity tenderness nor edema.  No joint effusions. Neurologic:  Normal speech and language. No gross focal neurologic deficits are appreciated.  Skin:  Skin is warm, dry and intact. No rash noted. Psychiatric: Mood and affect are normal. Speech and behavior are normal. GU: Deferred   ____________________________________________   LABS (all labs ordered are listed, but only abnormal results are displayed)  Labs Reviewed  CBC WITH DIFFERENTIAL/PLATELET - Abnormal; Notable for the following components:      Result Value   WBC 22.6 (*)    Neutro Abs 19.1 (*)    Monocytes Absolute 1.4 (*)    Abs Immature Granulocytes 0.16 (*)    All other components within normal limits  COMPREHENSIVE METABOLIC PANEL - Abnormal; Notable for the following components:   Sodium 133 (*)    CO2 21 (*)    Glucose, Bld 138 (*)    Total Bilirubin 1.4 (*)    All other components within normal limits  SARS CORONAVIRUS 2 (HOSPITAL ORDER, PERFORMED IN [facility])  GROUP A STREP BY PCR  BRAIN NATRIURETIC PEPTIDE  TROPONIN I (HIGH SENSITIVITY)  TROPONIN I (HIGH SENSITIVITY)   ____________________________________________   ED ECG REPORT I, Concha SeMary E Jahzaria Vary, the attending physician, personally viewed and interpreted this ECG.  EKG shows normal sinus rate of 88, no ST elevation, no T wave inversion ____________________________________________  RADIOLOGY Vela ProseI, Kortny Lirette E Nimrit Kehres, personally viewed and evaluated these images (plain radiographs) as part of my medical decision making, as well as reviewing  the written report by the radiologist.  ED MD interpretation: Chest x-ray shows a very small left pleural effusion  Official radiology report(s): Dg Chest Portable 1 View  Result Date: 04/29/2019 CLINICAL DATA:  Weakness.  Cough.  Smoker. EXAM: PORTABLE CHEST 1 VIEW COMPARISON:  No recent. FINDINGS: Mediastinum hilar structures normal. Cardiomegaly with normal pulmonary vascularity. Mild base atelectasis/infiltrate. Small left pleural effusion. No pneumothorax. IMPRESSION: 1.  Cardiomegaly.  No pulmonary venous congestion. 2. Mild left base atelectasis/infiltrate and small left pleural effusion. Electronically Signed   By: Maisie Fushomas  Register   On: 04/29/2019 11:56    ____________________________________________   PROCEDURES  Procedure(s) performed (including Critical Care):  Procedures   ____________________________________________   INITIAL IMPRESSION / ASSESSMENT AND PLAN / ED COURSE  Melony OverlyChristopher S Hellenbrand was evaluated in Emergency Department on 04/29/2019 for the symptoms described in the history of present illness. He was evaluated in the context of the global COVID-19 pandemic, which necessitated consideration that the  patient might be at risk for infection with the SARS-CoV-2 virus that causes COVID-19. Institutional protocols and algorithms that pertain to the evaluation of patients at risk for COVID-19 are in a state of rapid change based on information released by regulatory bodies including the CDC and federal and state organizations. These policies and algorithms were followed during the patient's care in the ED.     Patient is a well-appearing 43 year old who presents with most notably sore throat.  We will send a swab to evaluate for strep throat.  Patient had no evidence of peritonsillar abscess on exam.  Patient has full range of motion of neck and I have low suspicion for retropharyngeal abscess.  Also get coronavirus testing.  Will get a chest x-ray given report of some  shortness of breath but this now has seemed to resolve.   Patient is white count is noted to be 22.6.   1:38 PM reevaluated patient.  Patient has full range of motion of his neck.  No evidence of PTA.  Patient has no prior metal from prior surgeries in him to suggest infections from that.  Patient denies IV drug use and has no murmur to suggest endocarditis.  I have low suspicion for gonorrhea and chlamydia pharyngitis given he denies encounters to suggest this.  Patient has no headache or neck stiffness to suggest menigitis.  He has no risk factors to suggest bacteremia.  Troponin is unchanged making ACS less likely.  Discussed with patient that his white count elevation with his sore throat is most likely viral in nature.  Even though his coronavirus testing was negative he should continue to quarantine at home given my concern that this may be a false negative.  I did give a dose of Decadron for the sore throat and Toradol.  Patient says his sore throat is better.  Patient is able to tolerate p.o. and is very well-appearing.  If his symptoms are getting worse he should return to the ER.  Explained what to look out for with his sore throat to suggest PTA or RPA so that patient would know when to return.  At this time I think CT imaging would have more risk than benefit.     ____________________________________________   FINAL CLINICAL IMPRESSION(S) / ED DIAGNOSES   Final diagnoses:  Viral pharyngitis      MEDICATIONS GIVEN DURING THIS VISIT:  Medications  acetaminophen (TYLENOL) tablet 650 mg (650 mg Oral Given 04/29/19 1200)  dexamethasone (DECADRON) injection 10 mg (10 mg Intravenous Given 04/29/19 1352)  ketorolac (TORADOL) 30 MG/ML injection 30 mg (30 mg Intravenous Given 04/29/19 1352)     ED Discharge Orders    None       Note:  This document was prepared using Dragon voice recognition software and may include unintentional dictation errors.   Concha SeFunke, Libbey Duce E,  MD 04/29/19 [phone]

## 2020-05-21 ENCOUNTER — Ambulatory Visit: Payer: Self-pay | Attending: Internal Medicine

## 2020-05-21 DIAGNOSIS — Z23 Encounter for immunization: Secondary | ICD-10-CM

## 2020-05-21 NOTE — Progress Notes (Signed)
   Covid-19 Vaccination Clinic  Name:  Troy Silva    MRN: [id number] DOB: 1976-07-18  05/21/2020  [name] was observed post Covid-19 immunization for 15 minutes without incident. He was provided with Vaccine Information Sheet and instruction to access the V-Safe system.   [name] was instructed to call 911 with any severe reactions post vaccine: Marland Kitchen Difficulty breathing  . Swelling of face and throat  . A fast heartbeat  . A bad rash all over body  . Dizziness and weakness   Immunizations Administered    Name Date Dose VIS Date Route   Pfizer COVID-19 Vaccine 05/21/2020  4:38 PM 0.3 mL 11/30/2018 Intramuscular   Manufacturer: [employer], [employer]   Lot: [id number]   NDC: [address]-8

## 2020-06-18 ENCOUNTER — Ambulatory Visit: Payer: Self-pay

## 2020-10-09 IMAGING — DX PORTABLE CHEST - 1 VIEW
1 series · 1 of 1 positions shown · non-contrast
Comparison: No recent.

CLINICAL DATA: Weakness.  Cough.  Smoker.

EXAM:
PORTABLE CHEST 1 VIEW

[chest ap]
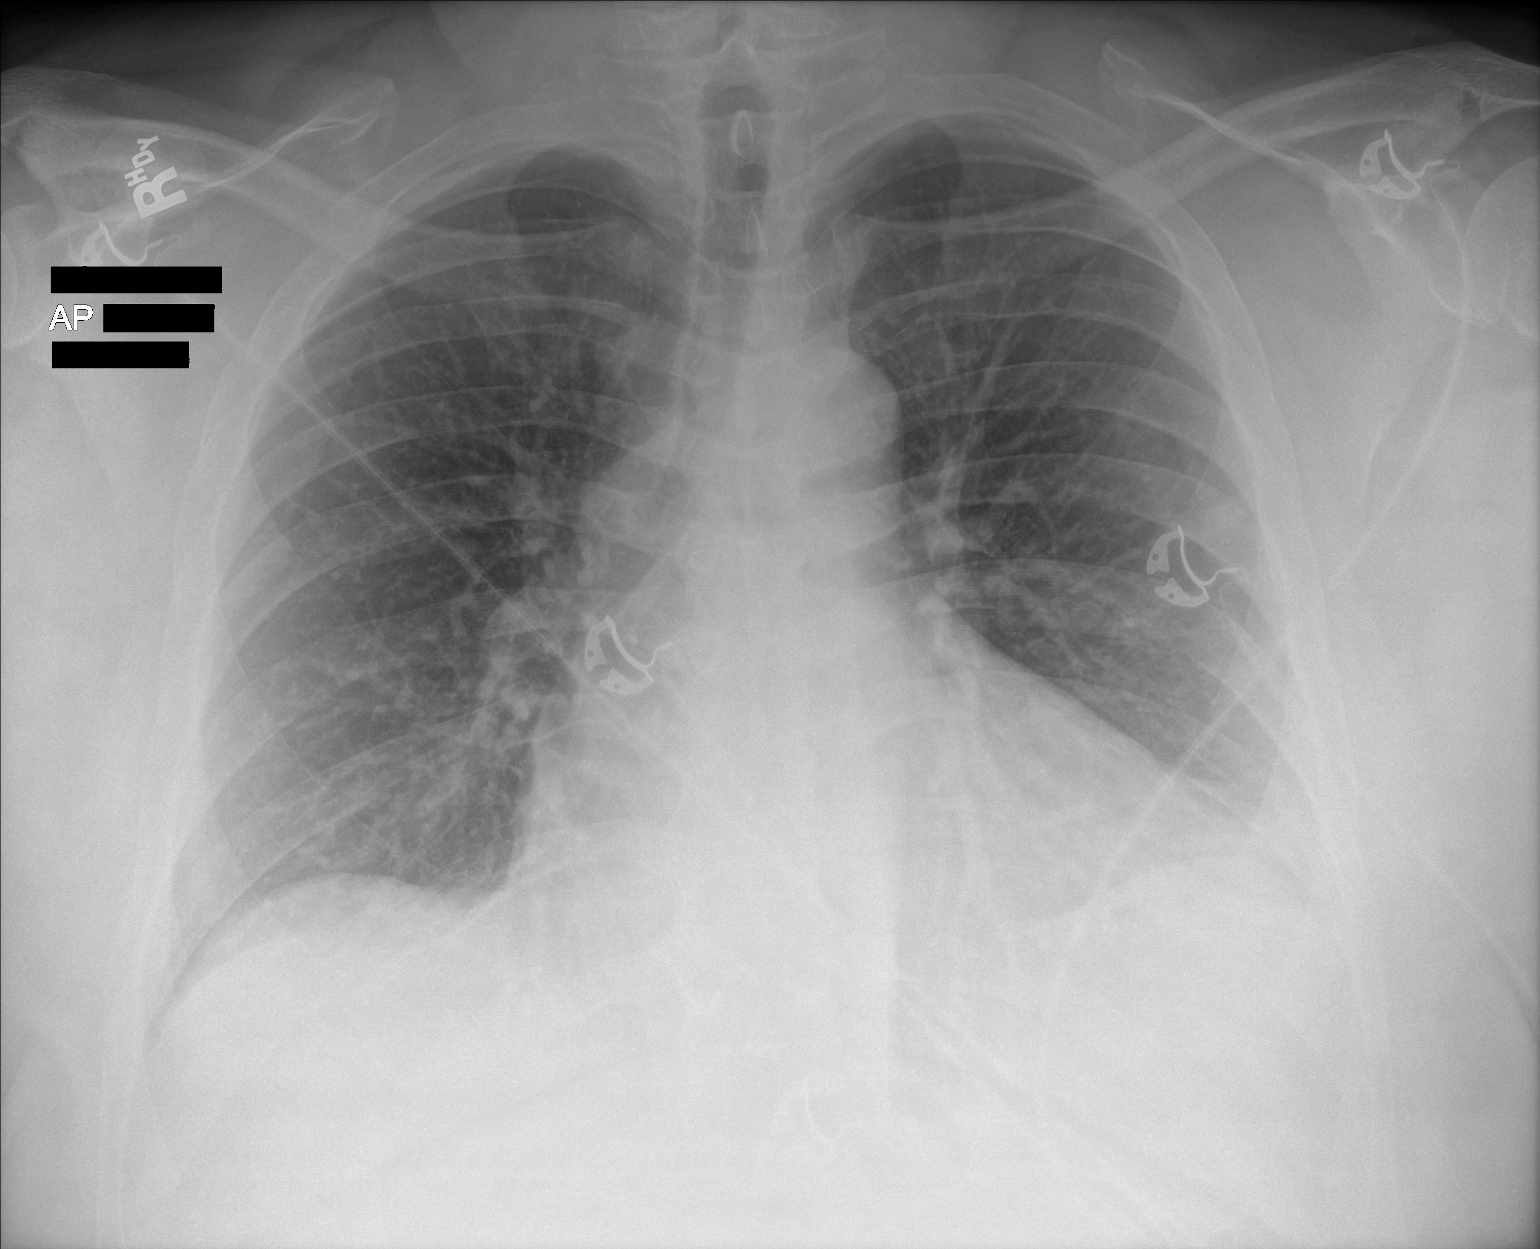

[1 of 1 positions shown; findings below may reference images not displayed]

FINDINGS: Mediastinum hilar structures normal. Cardiomegaly with normal
pulmonary vascularity. Mild base atelectasis/infiltrate. Small left
pleural effusion. No pneumothorax.
IMPRESSION: 1.  Cardiomegaly.  No pulmonary venous congestion.

2. Mild left base atelectasis/infiltrate and small left pleural
effusion.

## 2024-05-16 ENCOUNTER — Emergency Department
Admission: EM | Admit: 2024-05-16 | Discharge: 2024-05-16 | Disposition: A | Discharge status: Home / Self Care | Payer: Self-pay | Attending: Emergency Medicine | Admitting: Emergency Medicine

## 2024-05-16 ENCOUNTER — Emergency Department: Payer: Self-pay

## 2024-05-16 ENCOUNTER — Other Ambulatory Visit: Payer: Self-pay

## 2024-05-16 DIAGNOSIS — Y92410 Unspecified street and highway as the place of occurrence of the external cause: Secondary | ICD-10-CM | POA: Diagnosis not present

## 2024-05-16 DIAGNOSIS — S161XXA Strain of muscle, fascia and tendon at neck level, initial encounter: Secondary | ICD-10-CM | POA: Diagnosis not present

## 2024-05-16 DIAGNOSIS — R519 Headache, unspecified: Secondary | ICD-10-CM | POA: Insufficient documentation

## 2024-05-16 DIAGNOSIS — M542 Cervicalgia: Secondary | ICD-10-CM | POA: Diagnosis present

## 2024-05-16 MED ORDER — ACETAMINOPHEN 500 MG PO TABS
1000.0000 mg | ORAL_TABLET | Freq: Once | ORAL | Status: AC
  Administered 2024-05-16 (×2): 1000 mg via ORAL
  Filled 2024-05-16: qty 2
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
## 2024-05-16 NOTE — Discharge Instructions (Signed)
 You will likely be more sore tomorrow than you are today, this is normal. After this your pain should slowly be improving. If not, you need to be seen by another health care provider. This could be your PCP, urgent care or in the emergency department. Return to the emergency department specifically, if you have development of chest pain, shortness of breath, abdominal pain, new abdominal bruising or any other symptom personally concerning to you.  You can take 650 mg of Tylenol  and 600 mg of ibuprofen  every 6 hours as needed for pain. You can use ice, heat, muscle creams and other topical pain relievers as well.
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
